# Patient Record
Sex: Male | Born: 1995 | Race: White | Hispanic: No | Marital: Single | Smoking: Never smoker
Health system: Southern US, Community
[De-identification: ages and names within clinical notes are randomized; demographics above are authoritative.]

---

## 2017-03-14 ENCOUNTER — Encounter: Payer: Self-pay | Admitting: Emergency Medicine

## 2017-03-14 ENCOUNTER — Ambulatory Visit (INDEPENDENT_AMBULATORY_CARE_PROVIDER_SITE_OTHER): Payer: BC Managed Care – PPO | Admitting: Emergency Medicine

## 2017-03-14 VITALS — BP 128/77 | HR 87 | Temp 99.0°F | Resp 18 | Ht 74.0 in | Wt 265.8 lb

## 2017-03-14 DIAGNOSIS — J029 Acute pharyngitis, unspecified: Secondary | ICD-10-CM

## 2017-03-14 DIAGNOSIS — J020 Streptococcal pharyngitis: Secondary | ICD-10-CM | POA: Diagnosis not present

## 2017-03-14 LAB — POCT RAPID STREP A (OFFICE): RAPID STREP A SCREEN: POSITIVE — AB

## 2017-03-14 MED ORDER — PENICILLIN G BENZATHINE 1200000 UNIT/2ML IM SUSP
1.2000 10*6.[IU] | Freq: Once | INTRAMUSCULAR | Status: AC
  Administered 2017-03-14: 1.2 10*6.[IU] via INTRAMUSCULAR

## 2017-03-14 NOTE — Patient Instructions (Addendum)
     IF you received an x-ray today, you will receive an invoice from [facility]. Please contact  Radiology at [phone] with questions or concerns regarding your invoice.   IF you received labwork today, you will receive an invoice from [facility]. Please contact [facility] at [phone] with questions or concerns regarding your invoice.   Our billing staff will not be able to assist you with questions regarding bills from these companies.  You will be contacted with the lab results as soon as they are available. The fastest way to get your results is to activate your My Chart account. Instructions are located on the last page of this paperwork. If you have not heard from us regarding the results in 2 weeks, please contact this office.     Strep Throat Strep throat is an infection of the throat. It is caused by germs. Strep throat spreads from person to person because of coughing, sneezing, or close contact. Follow these instructions at home: Medicines  Take over-the-counter and prescription medicines only as told by your doctor.  Take your antibiotic medicine as told by your doctor. Do not stop taking the medicine even if you feel better.  Have family members who also have a sore throat or fever go to a doctor. Eating and drinking  Do not share food, drinking cups, or personal items.  Try eating soft foods until your sore throat feels better.  Drink enough fluid to keep your pee (urine) clear or pale yellow. General instructions  Rinse your mouth (gargle) with a salt-water mixture 3-4 times per day or as needed. To make a salt-water mixture, stir -1 tsp of salt into 1 cup of warm water.  Make sure that all people in your house wash their hands well.  Rest.  Stay home from school or work until you have been taking antibiotics for 24 hours.  Keep all follow-up visits as told by your doctor. This is important. Contact a doctor if:  Your neck keeps  getting bigger.  You get a rash, cough, or earache.  You cough up thick liquid that is green, yellow-brown, or bloody.  You have pain that does not get better with medicine.  Your problems get worse instead of getting better.  You have a fever. Get help right away if:  You throw up (vomit).  You get a very bad headache.  You neck hurts or it feels stiff.  You have chest pain or you are short of breath.  You have drooling, very bad throat pain, or changes in your voice.  Your neck is swollen or the skin gets red and tender.  Your mouth is dry or you are peeing less than normal.  You keep feeling more tired or it is hard to wake up.  Your joints are red or they hurt. This information is not intended to replace advice given to you by your health care provider. Make sure you discuss any questions you have with your health care provider. Document Released: 01/03/2008 Document Revised: 03/15/2016 Document Reviewed: 11/09/2014 Elsevier Interactive Patient Education  2018 [employer] Inc[employer]  

## 2017-03-14 NOTE — Progress Notes (Signed)
Wesley Hernandez 21 y.o.   Chief Complaint  Patient presents with  . Sore Throat    symptoms started this morning.  No temps taken, per pt he thinks he's had a fever.  No meds taken for symptoms  . Cough  . Nausea  . Headache    HISTORY OF PRESENT ILLNESS: This is a 21 y.o. 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 Had diarrhea yesterday.  Sore Throat   This is a new problem. The current episode started today. The problem has been gradually worsening. The maximum temperature recorded prior to his arrival was 100.4 - 100.9 F. The fever has been present for less than 1 day. The pain is at a severity of 5/10. The pain is moderate. Associated symptoms include congestion, coughing, diarrhea, headaches, trouble swallowing and vomiting. Pertinent negatives include no abdominal pain, ear discharge, ear pain or shortness of breath. He has had exposure to strep. He has tried nothing for the symptoms.     Prior to Admission medications   Not on File    No Known Allergies  There are no active problems to display for this patient.   History reviewed. No pertinent past medical history.  History reviewed. No pertinent surgical history.  Social History   Social History  . Marital status: Single    Spouse name: N/A  . Number of children: N/A  . Years of education: N/A   Occupational History  . Not on file.   Social History Main Topics  . Smoking status: Never Smoker  . Smokeless tobacco: Never Used  . Alcohol use No  . Drug use: No  . Sexual activity: Not on file   Other Topics Concern  . Not on file   Social History Narrative  . No narrative on file    History reviewed. No pertinent family history.   Review of Systems  Constitutional: Positive for fever and malaise/fatigue.  HENT: Positive for congestion, sore throat and trouble swallowing. Negative for ear discharge and ear pain.     Eyes: Negative for discharge and redness.  Respiratory: Positive for cough. Negative for shortness of breath.   Cardiovascular: Negative for chest pain, palpitations and leg swelling.  Gastrointestinal: Positive for diarrhea, nausea and vomiting. Negative for abdominal pain and blood in stool.  Genitourinary: Negative for dysuria and hematuria.  Musculoskeletal: Negative for joint pain and myalgias.  Skin: Negative for rash.  Neurological: Positive for headaches. Negative for dizziness.  Endo/Heme/Allergies: Negative.   All other systems reviewed and are negative.  Vitals:   03/14/17 0935  BP: 128/77  Pulse: 87  Resp: 18  Temp: 99 F (37.2 C)  SpO2: 99%     Physical Exam  Constitutional: He is oriented to person, place, and time. He appears well-developed and well-nourished.  HENT:  Head: Normocephalic and atraumatic.  Nose: Nose normal.  Mouth/Throat: Uvula is midline and mucous membranes are normal. Oropharyngeal exudate and posterior oropharyngeal erythema present. No tonsillar abscesses.  Eyes: Pupils are equal, round, and reactive to light. Conjunctivae and EOM are normal.  Neck: Normal range of motion. Neck supple.  Cardiovascular: Normal rate, regular rhythm and normal heart sounds.   Pulmonary/Chest: Effort normal and breath sounds normal.  Abdominal: Soft. There is no tenderness.  Musculoskeletal: Normal range of motion.  Lymphadenopathy:    He has cervical adenopathy.  Neurological: He is alert and oriented to person, place, and time.  Skin: Skin  is warm and dry. Capillary refill takes less than 2 seconds. No rash noted.  Psychiatric: He has a normal mood and affect. His behavior is normal.  Vitals reviewed.    Results for orders placed or performed in visit on 03/14/17 (from the past 24 hour(s))  POCT rapid strep A     Status: Abnormal   Collection Time: 03/14/17 10:05 AM  Result Value Ref Range   Rapid Strep A Screen Positive (A) Negative     ASSESSMENT  & PLAN: Lannie was seen today for sore throat, cough, nausea and headache.  Diagnoses and all orders for this visit:  Strep pharyngitis -     penicillin g benzathine (BICILLIN LA) 1200000 UNIT/2ML injection 1.2 Million Units; Inject 2 mLs (1.2 Million Units total) into the muscle once.  Sore throat -     POCT rapid strep A    Patient Instructions       IF you received an x-ray today, you will receive an invoice from [facility]. Please contact [facility] at [phone] with questions or concerns regarding your invoice.   IF you received labwork today, you will receive an invoice from [facility]. Please contact [facility] at [phone] with questions or concerns regarding your invoice.   Our billing staff will not be able to assist you with questions regarding bills from these companies.  You will be contacted with the lab results as soon as they are available. The fastest way to get your results is to activate your My Chart account. Instructions are located on the last page of this paperwork. If you have not heard from [place] regarding the results in 2 weeks, please contact this office.     Strep Throat Strep throat is an infection of the throat. It is caused by germs. Strep throat spreads from person to person because of coughing, sneezing, or close contact. Follow these instructions at home: Medicines  Take over-the-counter and prescription medicines only as told by your doctor.  Take your antibiotic medicine as told by your doctor. Do not stop taking the medicine even if you feel better.  Have family members who also have a sore throat or fever go to a doctor. Eating and drinking  Do not share food, drinking cups, or personal items.  Try eating soft foods until your sore throat feels better.  Drink enough fluid to keep your pee (urine) clear or pale yellow. General instructions  Rinse your mouth (gargle) with a salt-water mixture 3-4 times per day or  as needed. To make a salt-water mixture, stir -1 tsp of salt into 1 cup of warm water.  Make sure that all people in your house wash their hands well.  Rest.  Stay home from school or work until you have been taking antibiotics for 24 hours.  Keep all follow-up visits as told by your doctor. This is important. Contact a doctor if:  Your neck keeps getting bigger.  You get a rash, cough, or earache.  You cough up thick liquid that is green, yellow-brown, or bloody.  You have pain that does not get better with medicine.  Your problems get worse instead of getting better.  You have a fever. Get help right away if:  You throw up (vomit).  You get a very bad headache.  You neck hurts or it feels stiff.  You have chest pain or you are short of breath.  You have drooling, very bad throat pain, or changes in your voice.  Your neck is  swollen or the skin gets red and tender.  Your mouth is dry or you are peeing less than normal.  You keep feeling more tired or it is hard to wake up.  Your joints are red or they hurt. This information is not intended to replace advice given to you by your health care provider. Make sure you discuss any questions you have with your health care provider. Document Released: 01/03/2008 Document Revised: 03/15/2016 Document Reviewed: 11/09/2014 Elsevier Interactive Patient Education  2018 [employer].      Edwina Barth, MD [facility]

## 2018-04-26 ENCOUNTER — Inpatient Hospital Stay: Admission: RE | Admit: 2018-04-26 | Payer: Self-pay | Source: Ambulatory Visit

## 2018-04-26 ENCOUNTER — Emergency Department (HOSPITAL_COMMUNITY): Admission: EM | Admit: 2018-04-26 | Discharge: 2018-04-26 | Discharge status: Absent without leave | Payer: Self-pay

## 2018-04-26 ENCOUNTER — Ambulatory Visit (HOSPITAL_COMMUNITY)
Admission: RE | Admit: 2018-04-26 | Discharge: 2018-04-26 | Disposition: A | Discharge status: Home / Self Care | Payer: BC Managed Care – PPO | Source: Ambulatory Visit | Attending: Sports Medicine | Admitting: Sports Medicine

## 2018-04-26 ENCOUNTER — Other Ambulatory Visit: Payer: Self-pay | Admitting: Sports Medicine

## 2018-04-26 ENCOUNTER — Other Ambulatory Visit: Payer: Self-pay | Admitting: *Deleted

## 2018-04-26 DIAGNOSIS — R6884 Jaw pain: Secondary | ICD-10-CM

## 2018-04-29 ENCOUNTER — Telehealth: Payer: Self-pay | Admitting: Sports Medicine

## 2018-04-29 NOTE — Telephone Encounter (Signed)
  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 Laureen Ochs in the training room.  Patient is complaining of left-sided jaw pain but denies trauma.  X-rays, including a panoramic mandible x-ray, show no signs of fracture or dislocation.  He will be started on oral anti-inflammatories and we will follow-up with him in the training room at [facility].  His symptoms may be related to an ill fitting mouthpiece.

## 2019-09-01 IMAGING — DX DG MANDIBLE 1-3V
3 series · 3 of 3 positions shown · non-contrast
Comparison: None.

CLINICAL DATA: RIGHT TMJ pain.

EXAM:
MANDIBLE - 1-3 VIEW

[w mandible obl]
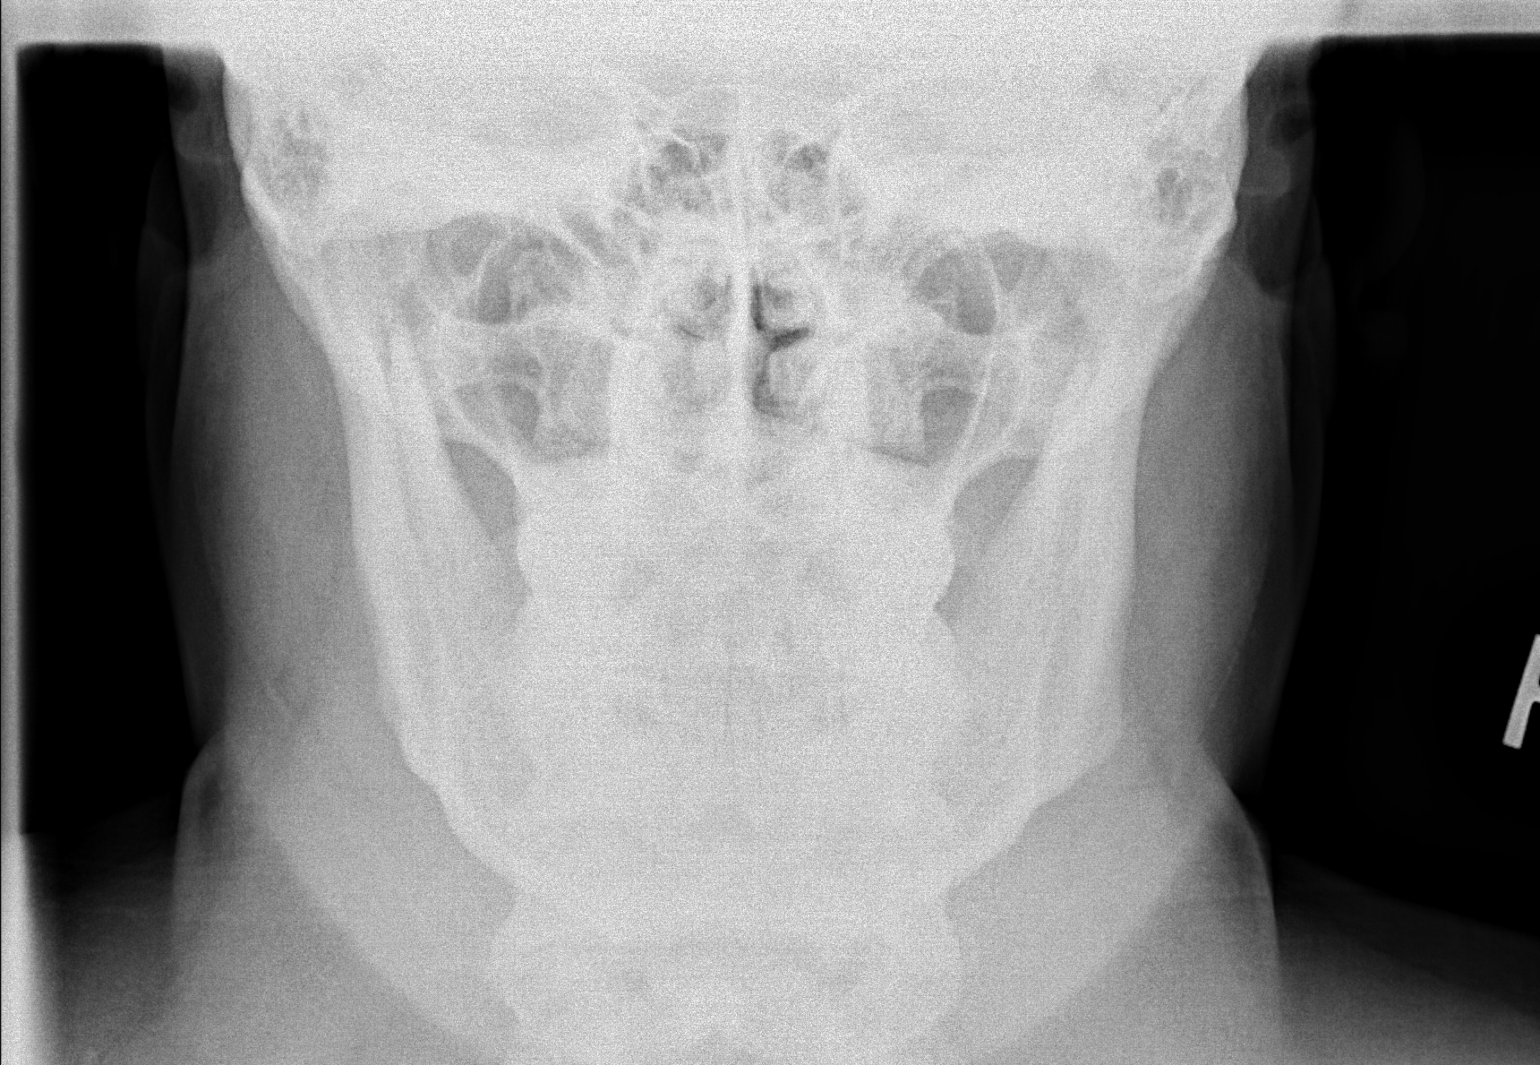

[w mandible pa]
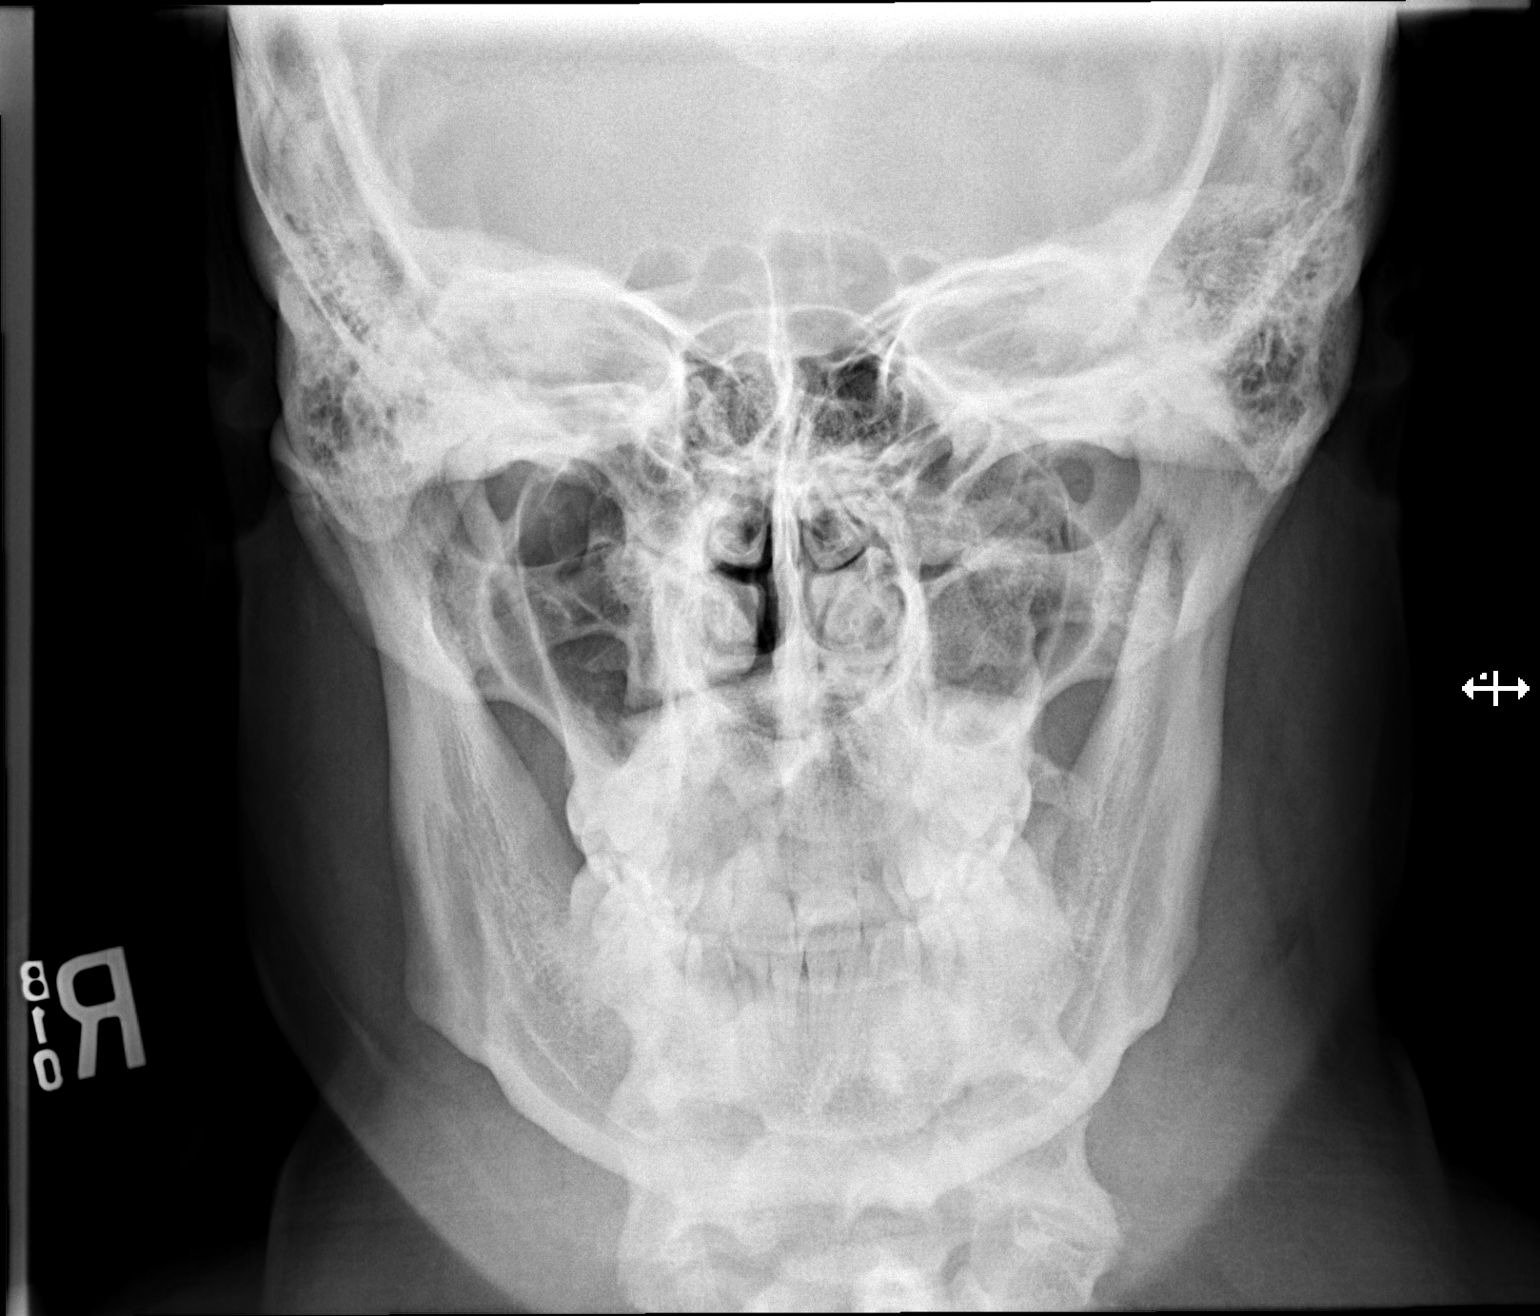

[[person_name]]
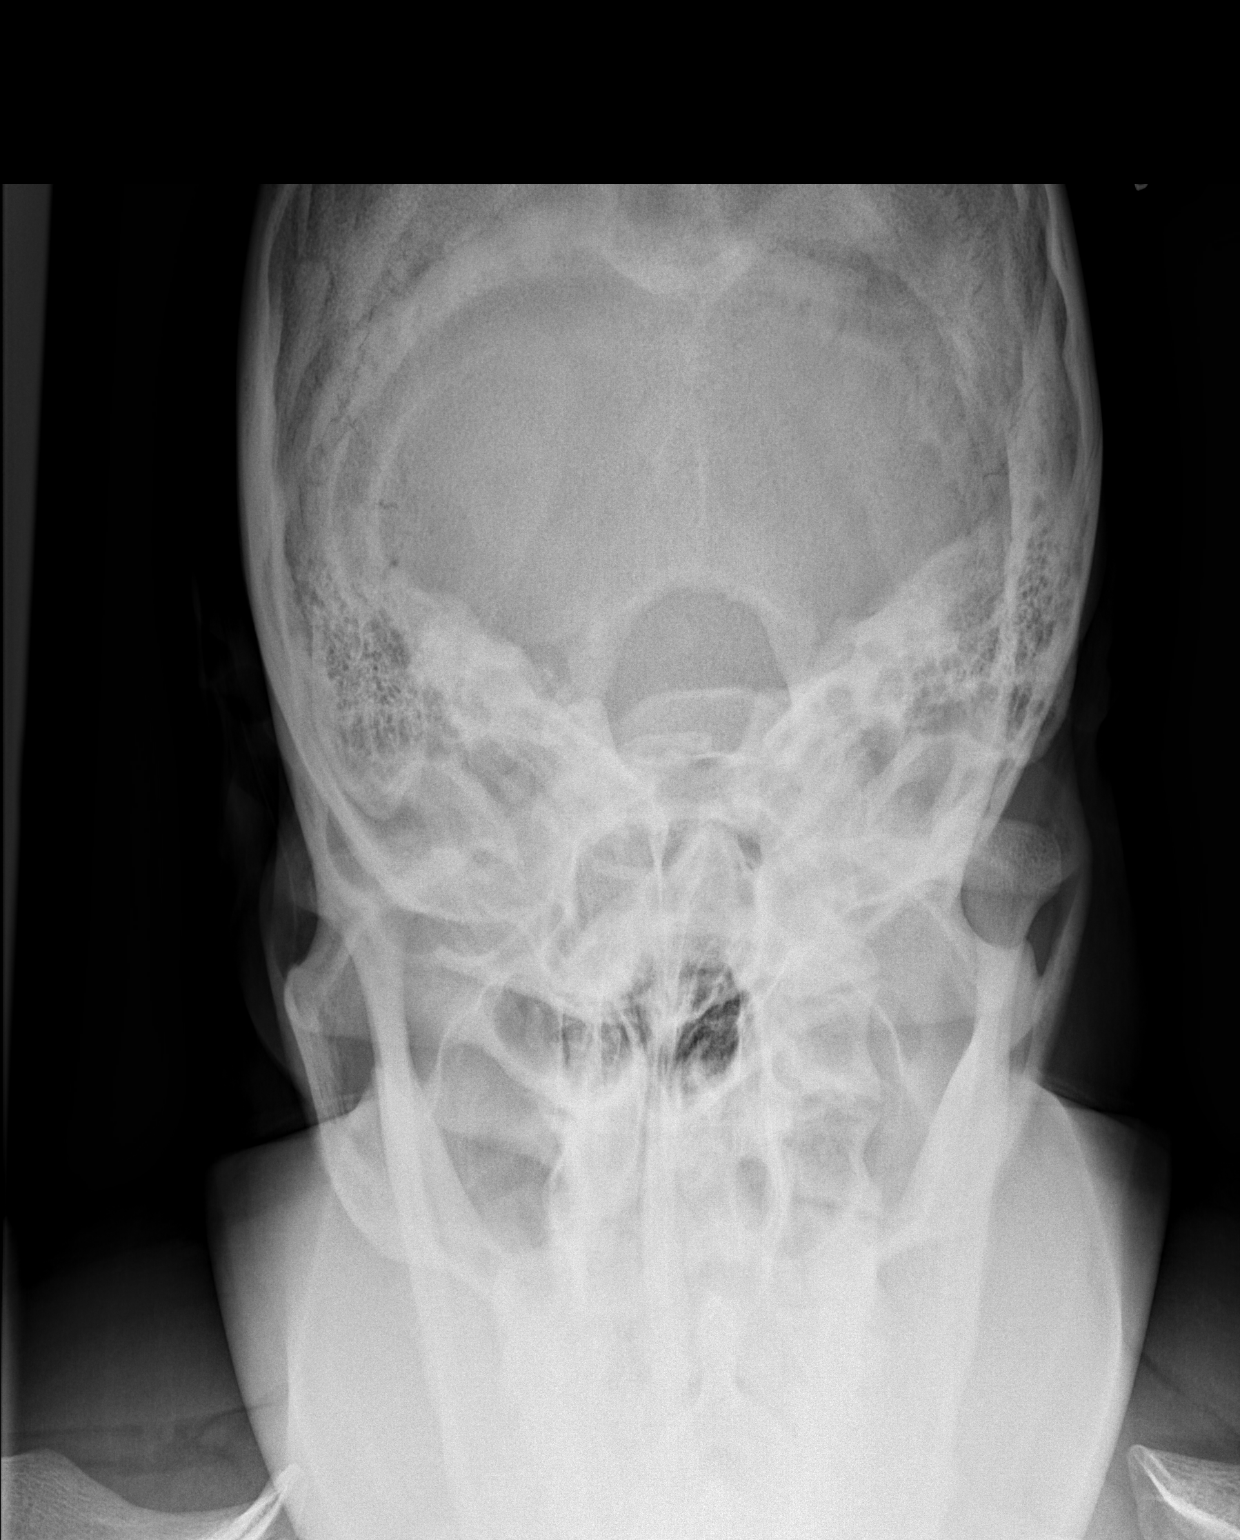

[3 of 3 positions shown; findings below may reference images not displayed]

FINDINGS: There is no evidence of fracture or other focal bone lesions. TMJs
appear located.
IMPRESSION: Negative.
